# Patient Record
Sex: Male | Born: 1935 | Race: White | Hispanic: No
Health system: Southern US, Community
[De-identification: ages and names within clinical notes are randomized; demographics above are authoritative.]

---

## 2006-12-31 ENCOUNTER — Ambulatory Visit (HOSPITAL_COMMUNITY): Admission: RE | Admit: 2006-12-31 | Discharge: 2006-12-31 | Payer: Self-pay | Admitting: *Deleted

## 2007-02-25 ENCOUNTER — Emergency Department (HOSPITAL_COMMUNITY): Admission: EM | Admit: 2007-02-25 | Discharge: 2007-02-25 | Payer: Self-pay | Admitting: Emergency Medicine

## 2007-07-16 ENCOUNTER — Ambulatory Visit: Payer: Self-pay | Admitting: Cardiology

## 2007-08-25 ENCOUNTER — Emergency Department (HOSPITAL_COMMUNITY): Admission: EM | Admit: 2007-08-25 | Discharge: 2007-08-26 | Payer: Self-pay | Admitting: Emergency Medicine

## 2007-10-21 IMAGING — CT CT CERVICAL SPINE W/O CM
3 of 6 series · 10 of 20 positions shown, 11 images · IV contrast (agent unspecified)
Comparison: None.
COMPARISON: None.

CLINICAL DATA: Fall, history of cervical fracture in [DATE].  
HEAD CT WITHOUT CONTRAST:
TECHNIQUE: Contiguous axial images were obtained from the base of the skull through the vertex according to standard protocol without contrast.
TECHNIQUE: Multidetector CT imaging of the cervical spine was performed.  Multiplanar CT image reconstructions were also generated.

[Series 3: recon 2: brain · axial · 0.47mm/px · z∈[-116,+19]mm · 4 of 104 slices shown]
[im 21/104  bone]
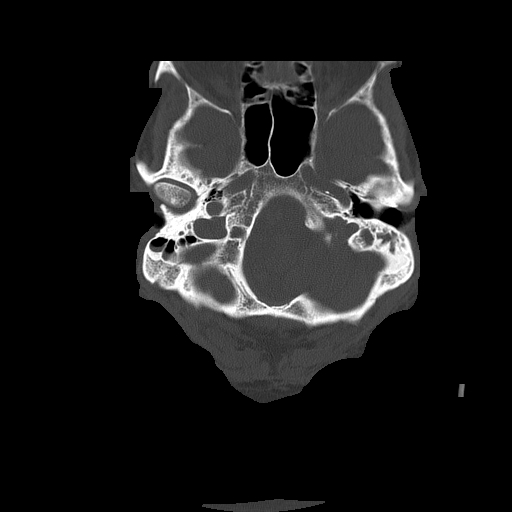
[im 42/104  bone]
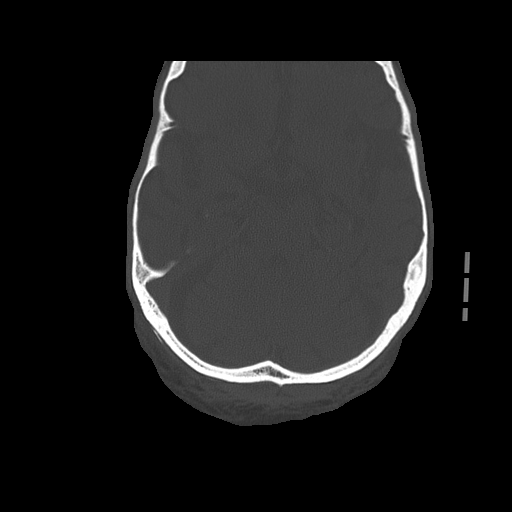
[im 62/104  bone]
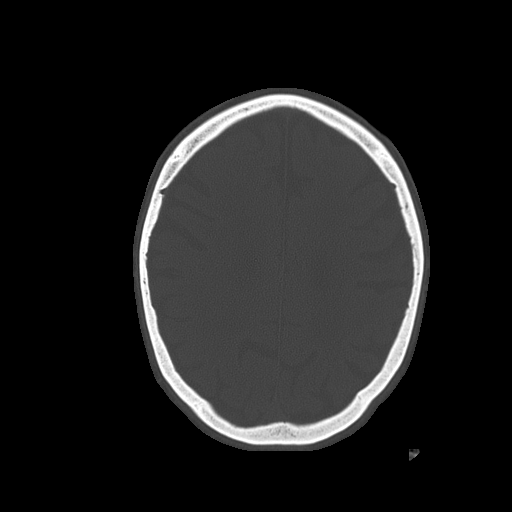
[im 83/104  bone]
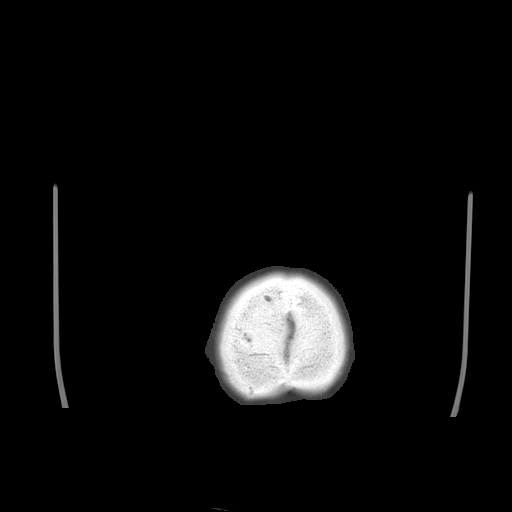

[Series 602: reformatted · axial · 0.29mm/px · z∈[-282,-154]mm · 3 of 50 slices shown, 4 images (1 of 2)]
[im 1/50  soft-tissue]
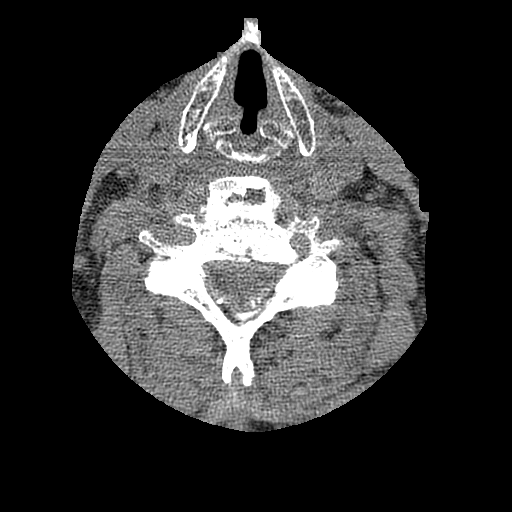
[im 1/50  bone]
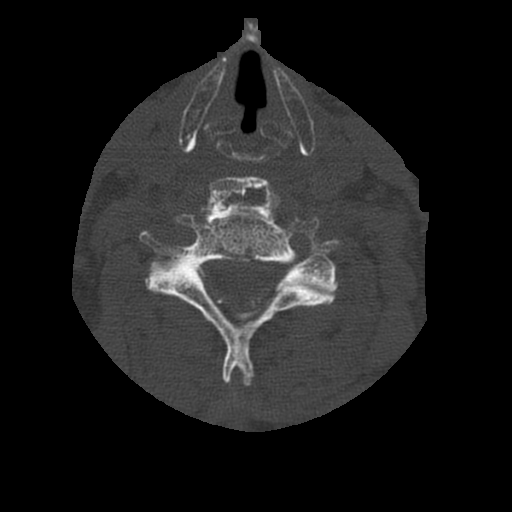
[im 25/50  bone]
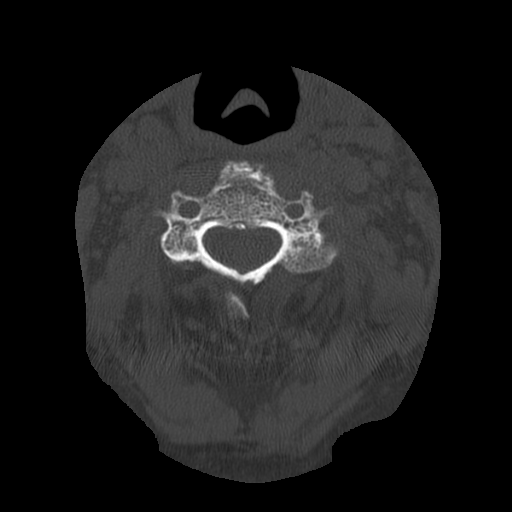
[im 50/50  bone]
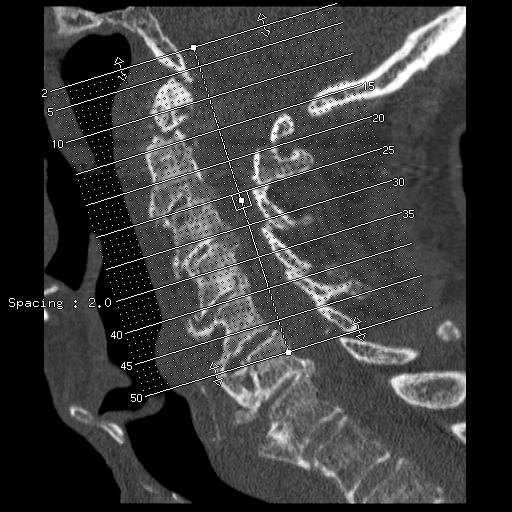

[Series 604: reformatted · coronal · 0.29mm/px · 3 of 30 slices shown (2 of 2)]
[im 6/30  bone]
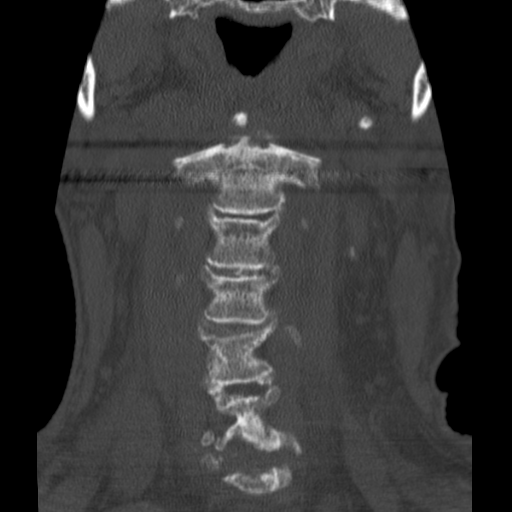
[im 12/30  bone]
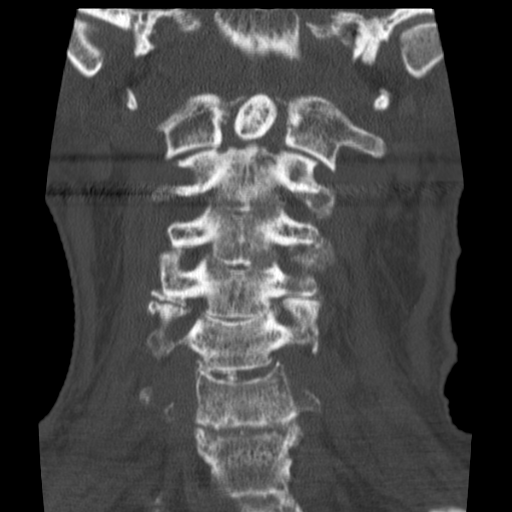
[im 18/30  bone]
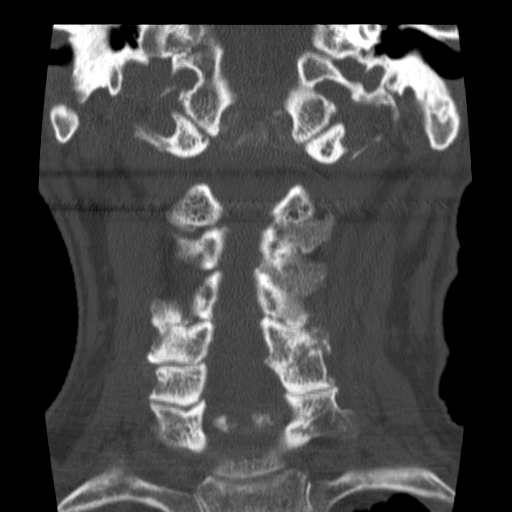

[10 of 20 positions shown; findings below may reference images not displayed]

FINDINGS: There is generalized atrophy of a moderate to advanced degree. Chronic appearing ischemic changes are noted in the white matter bilaterally.  There is no hemorrhage, mass or acute infarct.  There is a funnel scalp hematoma and laceration.  No skull fracture.  There is chronic sinusitis.
IMPRESSION: Atrophy and chronic small vessel ischemic change.  No acute intracranial abnormality. 
CERVICAL SPINE CT WITHOUT CONTRAST:
FINDINGS: There is a fracture through the base of the dens. There is a defect in the anterior arch of C1 and also a fracture of the posterior arch of C1, these may be related to the known prior injury.    The dens is tilted posteriorly and mildly displaced.  This may be related to the patient?s fracture described in [DATE] but I don?t have prior images for comparison.   I see no evidence of bony healing.  Correlation with a prior report is suggested.   
The alignment shows mild anterior slip of C5 on C6 of approximately 2-3 mm.  Diffuse disc degeneration and spurring are present throughout the cervical spine, most prominent at C6-7.  There is also diffuse facet arthropathy in the cervical spine.
IMPRESSION: Fracture at the base of the dens with mild tilting of the dens and mild displacement.  It is possible this is related to a prior known fracture.  However I don?t have prior reports or images for confirmation.  This was discussed with Dr. Fortuna at the time of interpretation.  Diffuse cervical disc degeneration is present.

## 2007-12-15 ENCOUNTER — Ambulatory Visit: Payer: Self-pay | Admitting: Cardiology

## 2008-04-19 IMAGING — CR DG CHEST 1V PORT
1 series · 1 of 1 positions shown · non-contrast
Comparison: none

HISTORY: Chest pain

[view not recorded]
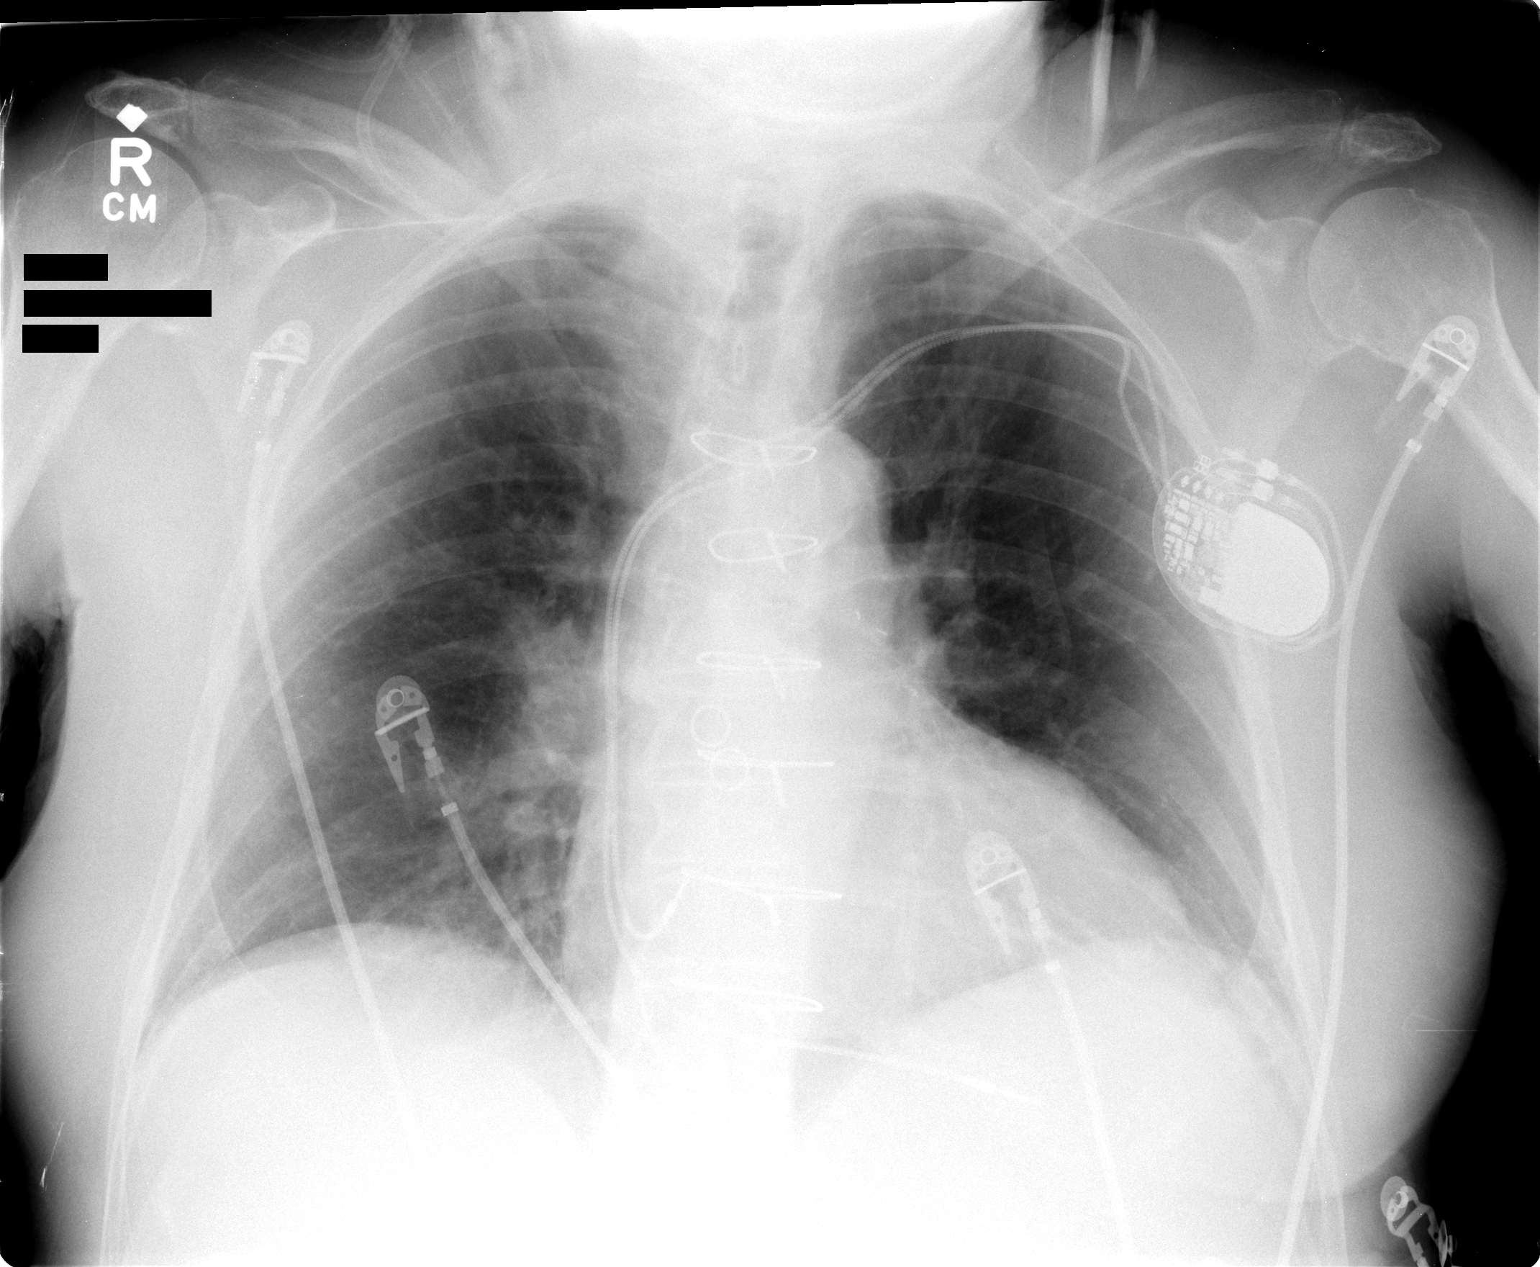

[1 of 1 positions shown; findings below may reference images not displayed]

PORTABLE CHEST ONE VIEW:

Portable exam 2226 hours without priors for comparison.

Cardiac enlargement status post CABG.
Left subclavian transvenous pacemaker leads project over right atrium and right
ventricle.
Mildly tortuous aorta with atherosclerotic calcifications at aortic arch.
Slight pulmonary vascular congestion.
Mild bibasilar atelectasis.
Cannot exclude early right lower lobe infiltrate.
Remaining lungs clear.
No pleural effusion or pneumothorax.
IMPRESSION: Cardiac enlargement status post CABG and pacemaker.
Minimal bibasilar atelectasis, greater on right, cannot exclude early right
basilar infiltrate.

## 2011-03-20 NOTE — Assessment & Plan Note (Signed)
[facility]                            CARDIOLOGY OFFICE NOTE   Steve Knox, Steve Knox                       MRN:          [id number]  DATE:12/15/2007                            DOB:          10/04/1919    Steve Knox is here today and appears to be stable.  His neck is in a  brace.  I had seen him in consultation on July 16, 2007.  Following  that I received some additional information from [facility]  in a very timely fashion and I have outlined all the specifics of his  pacemaker and other medical testing.  His overall cardiac status is  stable.  He is not having any significant chest pain.  He does have  known coronary disease.   PAST MEDICAL HISTORY:   ALLERGIES:  PENICILLIN.   MEDICATIONS:  Aspirin, iron, Depakote, Prilosec, metoprolol, Os-Cal,  Namenda, oxycodone, Aricept,  Proscar, NovoLog, Lipitor and Tamsulosin  at night.   OTHER MEDICAL PROBLEMS:  See the extensive list on my note of July 16, 2007.   REVIEW OF SYSTEMS:  From questioning the patient today, he has no major  complaints. Review of systems is negative.   PHYSICAL EXAM:  Weight is 140 pounds.  Blood pressure is 117/64 with a  pulse of 75.  The patient is quite limited. He is wearing a neck brace.  He is lying in a large wheelchair.  He is responding to me.  HEENT:  Reveals no xanthelasma.  He has no carotid bruits and no jugular  venous distention.  LUNGS:  Reveal scattered rhonchi.  CARDIAC:  Reveals an S1 with an S2.  There are no clicks or significant  murmurs.  ABDOMEN:  Soft.  He has no peripheral edema.   It is my understanding that the patient's pacemaker has been checked and  we will check our records here.   PROBLEMS:  1. Diabetes.  2. Hyperlipidemia.  3. Hypertension treated.  4. History of angina in the past.  5. History of CABG.  6. History of mitral regurgitation.  7. 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  8.  Carotid stenoses. He had a Doppler in July 2007 showing less than      50% stenosis bilaterally, therefore he does not need a followup      study at this time but it can be repeated in a year.  9. Other peripheral vascular disease.  10.Allergy to PENICILLIN.  11.Status post permanent pacemaker.  12.History of claudication.  13.Ejection fraction 55% by echo in March 2007.  14.Aortic valve sclerosis.  15.Carotid disease.   The patient had a nuclear exercise test in February 2007.  There was no  ischemia.  He is not having significant symptoms.   Mr. Gendron is stable.  I will not be changing any of his medications.  Follow-up with me can be in 1 year.     Luis Abed, MD, [facility]  Electronically Signed    JDK/MedQ  DD: 12/15/2007  DT: 12/16/2007  Job #: [id number]   cc:   Frederica Kuster,  MD  [facility]

## 2011-03-20 NOTE — Assessment & Plan Note (Signed)
[facility]                            CARDIOLOGY OFFICE NOTE   SAMWISE, ECKARDT                       MRN:          [id number]  DATE:07/16/2007                            DOB:          1919-05-22    CARDIOLOGY CONSULTATION   Mr. Mcmichen is referred for cardiac evaluation by Dr. Jacalyn Lefevre at  [facility].  Mr. Gaymon has known cardiac  disease.  He has been cared for by Dr. Andrey Campanile with the [facility] office in the [facility] in [place] and we will request information.  His pacemaker is still  checked by phone by his prior group in [place].  I do not have any  other significant information.  I have spoken by telephone today with  the patient's daughter, Azucena Fallen on phone [phone].  She had  arranged for him to be brought to [facility] after he was discharged  from [facility] in January of 2008.  He had been in assisted  living, fell, and had a fractured cervical spine.  After he was  discharged he was brought to [facility].  The patient does have  significant Alzheimer's.  His daughter says that, when she visits, he  does not remember afterwards that she has been there.  In the past  several weeks, he had some decrease in his O2 saturation.  There was  question of some chest pain and nitroglycerin was used.  He improved and  then he did not remember the episode any further.  The patient does have  a history of CABG in the 1990s and he had the pacemaker.   Today, he says he is not having any significant chest pain.   PAST MEDICAL HISTORY:   ALLERGIES:  PENICILLIN.   MEDICATIONS:  1. Aspirin 81 mg.  2. Iron.  3. Depakote 125.  4. Prilosec OTC.  5. Metoprolol 12.5 b.i.d.  6. Os-Cal.  7. Namenda 10 b.i.d.  8. Reglan 5 b.i.d.  9. Oxycodone as needed.  10.Lipitor 20.  11.Aricept 10.  12.Cozaar 5.  13.Remeron 15.  14.Depakote 250 in the  evening.  15.NovoLog.  16.Lantus.   OTHER MEDICAL PROBLEMS:  See the list below.   SOCIAL HISTORY:  The patient's daughter does live in town, as mentioned.  He does not smoke.  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   FAMILY HISTORY:  Noncontributory at this time.   REVIEW OF SYSTEMS:  The review of systems is limited, as the patient has  no significant memory.  However, today, he has no significant  complaints.  His review of systems otherwise is negative.   PHYSICAL EXAM:  Blood pressure is 150/80 with a pulse of 79.  The patient is in a wheelchair.  He is wearing a soft collar around his  neck.  I believe he wears a hard collar when he sleeps.  HEENT:  Reveals no xanthelasma.  He has normal extraocular motions.  NECK:  Exam is difficult with his soft collar.  LUNGS:  Reveal no obvious rales.  He has no respiratory distress.  CARDIAC:  Reveals an S1 with an S2.  There is a 2/6 systolic murmur.  ABDOMEN:  Soft.  He has normal bowel sounds.  He has no significant peripheral edema at this time.  As mentioned, he  is in a wheelchair and he has a soft collar in place.   EKG reveals that he is paced with an electronic atrial pacemaker.  His  underlying QRS reveals pulmonary disease pattern.  I do not have any  other labs.   PROBLEMS:  1. History of coronary artery bypass grafting in 1990s.  2. History of a pacemaker in the past, and it is followed through the      original office in [place].  We will obtain information      concerning his prior cardiac history from Dr. Tawana Scale team.  3. History of shortness of breath, but this is stable now.  4. History of hypertension.  5. History of diabetes.  6. Significant Alzheimer's dementia.  7. Hyperlipidemia.  8. Status post fracture of the cervical spine in January of 2008.  9. Recent episode of chest discomfort, which the patient cannot      remember.  10.PENICILLIN allergy.   Considering all factors, I believe it  is most prudent not to change his  medicines and not to proceed with any other workup.  We will obtain data  from his prior cardiologist in [place].  We  will follow him as needed.  We will be in touch with his daughter as  needed over time.  She was very helpful over the phone today.     Luis Abed, MD, [facility]  Electronically Signed    JDK/MedQ  DD: 07/16/2007  DT: 07/17/2007  Job #: [id number]   cc:   Bertram Millard. Hyacinth Meeker, M.D.

## 2011-03-20 NOTE — Assessment & Plan Note (Signed)
[facility]                            CARDIOLOGY OFFICE NOTE   LETCHER, SCHWEIKERT                       MRN:          [id number]  DATE:07/16/2007                            DOB:          Jan 11, 1919    The patient was seen as a new patient in the office on July 16, 2007.  We had requested outside information from [facility].  It came in a timely fashion.  I have reviewed all of this  data today.  Additional information to be added to chart includes:  1. The permanent pacemaker placement was done on January 05, 2005.  The      patient received a Medtronic model Kappa DR [id number].  There was a      Medtronic lead in the ventricle, model #5076 and in the atrium the      lead was Medtronic 5076.  2. The patient had an echo report from March of 2007.  There was some      asymmetric septal hypertrophy.  Ejection fraction was 55%.  There      was mild mitral regurgitation and there was aortic valve sclerosis.  3. The patient had a nuclear scan on January 01, 2006.  The      impression was that the patient had no fixed or reversible      perfusion defects.  The study was not gated, therefore, ejection      fraction could not be estimated.  4. The patient had a carotid Doppler in July of 2007.  This study      showed that the right internal carotid artery was less than 50%      stenosed and stable from a prior study and the left internal      carotid artery was less than 50% stenosed.  If anything, this area      was slightly better than the prior study.   The electronic medical record problem list from [facility]  included the following:  1. Diabetes.  2. Hyperlipidemia.  3. Hypertension.  4. History of angina in the past.  5. History of coronary disease, post CABG.  6. History of mitral regurgitation.  7. History of bradycardia.  8. Carotid stenoses.  9. Peripheral vascular disease.  10.ALLERGY TO PENICILLIN.  11.Status post pacemaker.  12.Status post CABG.  13.Some history of claudication.   The information in today's dictation will be integrated into the  patient's care along with my note of July 16, 2007, when I see the  patient in followup.     Luis Abed, MD, [facility]  Electronically Signed    JDK/MedQ  DD: 10/14/2007  DT: 10/14/2007  Job #: [id number]

## 2011-08-15 LAB — I-STAT 8, (EC8 V) (CONVERTED LAB)
Chloride: 102
HCT: 35 — ABNORMAL LOW
Operator id: 272551
Sodium: 138
pCO2, Ven: 50.9 — ABNORMAL HIGH
pH, Ven: 7.389 — ABNORMAL HIGH

## 2011-08-15 LAB — CBC
Hemoglobin: 11.5 — ABNORMAL LOW
MCV: 91.3
RBC: 3.71 — ABNORMAL LOW
RDW: 14.6 — ABNORMAL HIGH

## 2011-08-15 LAB — DIFFERENTIAL
Basophils Relative: 0
Eosinophils Absolute: 0.3
Monocytes Absolute: 0.7
Monocytes Relative: 7
Neutro Abs: 8 — ABNORMAL HIGH
Neutrophils Relative %: 82 — ABNORMAL HIGH

## 2011-08-15 LAB — POCT CARDIAC MARKERS
CKMB, poc: 1.3
CKMB, poc: 1.6
Myoglobin, poc: 118
Operator id: 272551
Troponin i, poc: <0.05

## 2019-02-17 ENCOUNTER — Telehealth: Payer: Self-pay | Admitting: *Deleted

## 2019-02-17 NOTE — Telephone Encounter (Signed)
[id number]/TCT-home phone spoken with wife concerning the recent death of the patient.  Offered prayers and support./Archita Lomeli,BSN,RN3,CCM,CN
